# Patient Record
Sex: Female | Born: 1975 | Race: Black or African American | Hispanic: No | Smoking: Current every day smoker
Health system: Southern US, Community
[De-identification: ages and names within clinical notes are randomized; demographics above are authoritative.]

## PROBLEM LIST (undated history)

## (undated) DIAGNOSIS — E785 Hyperlipidemia, unspecified: Secondary | ICD-10-CM

## (undated) DIAGNOSIS — E119 Type 2 diabetes mellitus without complications: Secondary | ICD-10-CM

## (undated) DIAGNOSIS — I10 Essential (primary) hypertension: Secondary | ICD-10-CM

## (undated) HISTORY — PX: BREAST LUMPECTOMY: SHX2

## (undated) HISTORY — PX: ABDOMINAL HYSTERECTOMY: SHX81

---

## 2018-03-06 ENCOUNTER — Emergency Department: Payer: Self-pay

## 2018-03-06 ENCOUNTER — Emergency Department
Admission: EM | Admit: 2018-03-06 | Discharge: 2018-03-06 | Disposition: A | Discharge status: Home / Self Care | Payer: Self-pay | Attending: Emergency Medicine | Admitting: Emergency Medicine

## 2018-03-06 DIAGNOSIS — F17200 Nicotine dependence, unspecified, uncomplicated: Secondary | ICD-10-CM | POA: Insufficient documentation

## 2018-03-06 DIAGNOSIS — I10 Essential (primary) hypertension: Secondary | ICD-10-CM | POA: Insufficient documentation

## 2018-03-06 DIAGNOSIS — Y929 Unspecified place or not applicable: Secondary | ICD-10-CM | POA: Insufficient documentation

## 2018-03-06 DIAGNOSIS — S62525A Nondisplaced fracture of distal phalanx of left thumb, initial encounter for closed fracture: Secondary | ICD-10-CM

## 2018-03-06 DIAGNOSIS — E119 Type 2 diabetes mellitus without complications: Secondary | ICD-10-CM | POA: Insufficient documentation

## 2018-03-06 DIAGNOSIS — Y999 Unspecified external cause status: Secondary | ICD-10-CM | POA: Insufficient documentation

## 2018-03-06 DIAGNOSIS — W2105XA Struck by basketball, initial encounter: Secondary | ICD-10-CM | POA: Insufficient documentation

## 2018-03-06 DIAGNOSIS — Y9367 Activity, basketball: Secondary | ICD-10-CM | POA: Insufficient documentation

## 2018-03-06 HISTORY — DX: Hyperlipidemia, unspecified: E78.5

## 2018-03-06 HISTORY — DX: Type 2 diabetes mellitus without complications: E11.9

## 2018-03-06 HISTORY — DX: Essential (primary) hypertension: I10

## 2018-03-06 MED ORDER — TRAMADOL HCL 50 MG PO TABS
100.0000 mg | ORAL_TABLET | Freq: Once | ORAL | Status: AC
  Administered 2018-03-06: 100 mg via ORAL
  Filled 2018-03-06: qty 2

## 2018-03-06 MED ORDER — TRAMADOL HCL 50 MG PO TABS: 50.0000 mg | ORAL_TABLET | Freq: Four times a day (QID) | ORAL | 0 refills | Status: AC | PRN

## 2018-03-06 NOTE — ED Notes (Signed)
Pt c/o left thumb pain x3days from playing basketball and hitting her thumb against the ball. Pt has noticeable swelling and is unable to bend affected finger.

## 2018-03-06 NOTE — ED Triage Notes (Signed)
Patient jammed/hurt 1st digit left hand playing basketball approx 3 days ago. Patient reports she cannot flex/extend finger.

## 2018-03-06 NOTE — Discharge Instructions (Signed)
Keep the splint in place until you are evaluated by ortho. Take the pain medicine as needed. Apply ice to reduce pain and swelling.

## 2018-03-07 NOTE — ED Provider Notes (Signed)
[facility] Emergency Department Provider Note ____________________________________________  Time seen: 2105  I have reviewed the triage vital signs and the nursing notes.  HISTORY  Chief Complaint  Finger Injury  HPI Kristin Snow is a 42 y.o.right-handed female presents to the ED for evaluation of left thumb pain.  Patient describes an injury occurring about 3 days prior.  She admits to playing basketball and she went up to swat the basketball and noted her left thumb nail got caught and hyperextended.  She thought at the time that she had torn the nail away.  Since that time she had pain, swelling, and bruising to the fat pad of the thumb.  She presents now due to increased pain, swelling, and disability to the thumb.  She denies any other injury at this time.  Past Medical History:  Diagnosis Date  . Diabetes mellitus without complication (HCC)   . Hyperlipidemia   . Hypertension     There are no active problems to display for this patient.   Past Surgical History:  Procedure Laterality Date  . ABDOMINAL HYSTERECTOMY     [facility]  . BREAST LUMPECTOMY      Prior to Admission medications   Medication Sig Start Date End Date Taking? Authorizing Provider  traMADol (ULTRAM) 50 MG tablet Take 1 tablet (50 mg total) by mouth every 6 (six) hours as needed. 03/06/18 03/06/19  Raffael Bugarin, Charlesetta IvoryJenise V Bacon, PA-C    Allergies Penicillins and Reglan [metoclopramide]  No family history on file.  Social History Social History   Tobacco Use  . Smoking status: Current Every Day Smoker    Packs/day: 0.50  . Smokeless tobacco: Never Used  Substance Use Topics  . Alcohol use: Yes    Comment: occiassional  . Drug use: No    Review of Systems  Constitutional: Negative for fever. Cardiovascular: Negative for chest pain. Respiratory: Negative for shortness of breath. Musculoskeletal: Negative for back pain.  Left thumb pain as above. Skin: Negative for  rash. Neurological: Negative for headaches, focal weakness or numbness. ____________________________________________  PHYSICAL EXAM:  VITAL SIGNS: ED Triage Vitals [03/06/18 1940]  Enc Vitals Group     BP 140/64     Pulse Rate 94     Resp 18     Temp 98.3 F (36.8 C)     Temp Source Oral     SpO2 99 %     Weight 203 lb (92.1 kg)     Height 5\' 5"  (1.651 m)     Head Circumference      Peak Flow      Pain Score 8     Pain Loc      Pain Edu?      Excl. in GC?     Constitutional: Alert and oriented. Well appearing and in no distress. Head: Normocephalic and atraumatic. Cardiovascular: Normal rate, regular rhythm. Normal distal pulses. Respiratory: Normal respiratory effort.  Musculoskeletal: Left thumb without obvious deformity.  Patient does have some ecchymosis and edema noted to the distal palmar fat pad.  There is a very small subungual hematoma at the cuticle.  Patient with tenderness to palpation over the distal phalanx of the thumb.  Normal composite fist.  Nontender with normal range of motion in all extremities.  Neurologic:  Normal gross sensation.  Normal transient opposition testing. No gross focal neurologic deficits are appreciated. Skin:  Skin is warm, dry and intact. No rash noted. ____________________________________________   RADIOLOGY  Left Thumb IMPRESSION: Fracture of the  distal phalanx of the thumb. ____________________________________________  PROCEDURES  Procedures Tramadol 100 mg PO Thumb alumafoam splint ____________________________________________  INITIAL IMPRESSION / ASSESSMENT AND PLAN / ED COURSE  Patient with ED evaluation of acute left thumb pain and initial fracture management.  Patient sustained injury after she smacked a basketball with the hand.  She has sustained a comminuted distal pharynx fracture of the left thumb.  There is no nailbed injury or overlying laceration.  Patient is placed in an AlumaFoam splint and given instructions  to follow-up with orthopedics.  She is given a perception for tramadol for pain relief. ____________________________________________  FINAL CLINICAL IMPRESSION(S) / ED DIAGNOSES  Final diagnoses:  Closed nondisplaced fracture of distal phalanx of left thumb, initial encounter      Lissa Hoard, PA-C 03/07/18 1635    Myrna Blazer, MD 03/07/18 [phone]

## 2019-03-08 IMAGING — CR DG FINGER THUMB 2+V*L*
3 series · 3 of 3 positions shown · non-contrast
Comparison: None.

CLINICAL DATA: 41-year-old female with trauma to the left thumb.

EXAM:
LEFT THUMB 2+V

[finger ap]
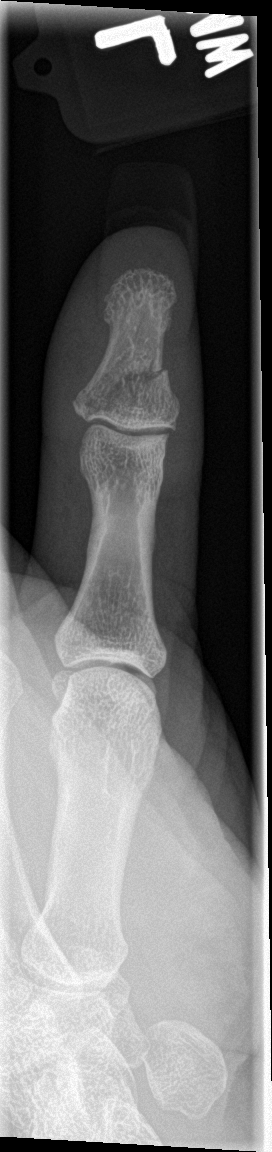

[finger obl]
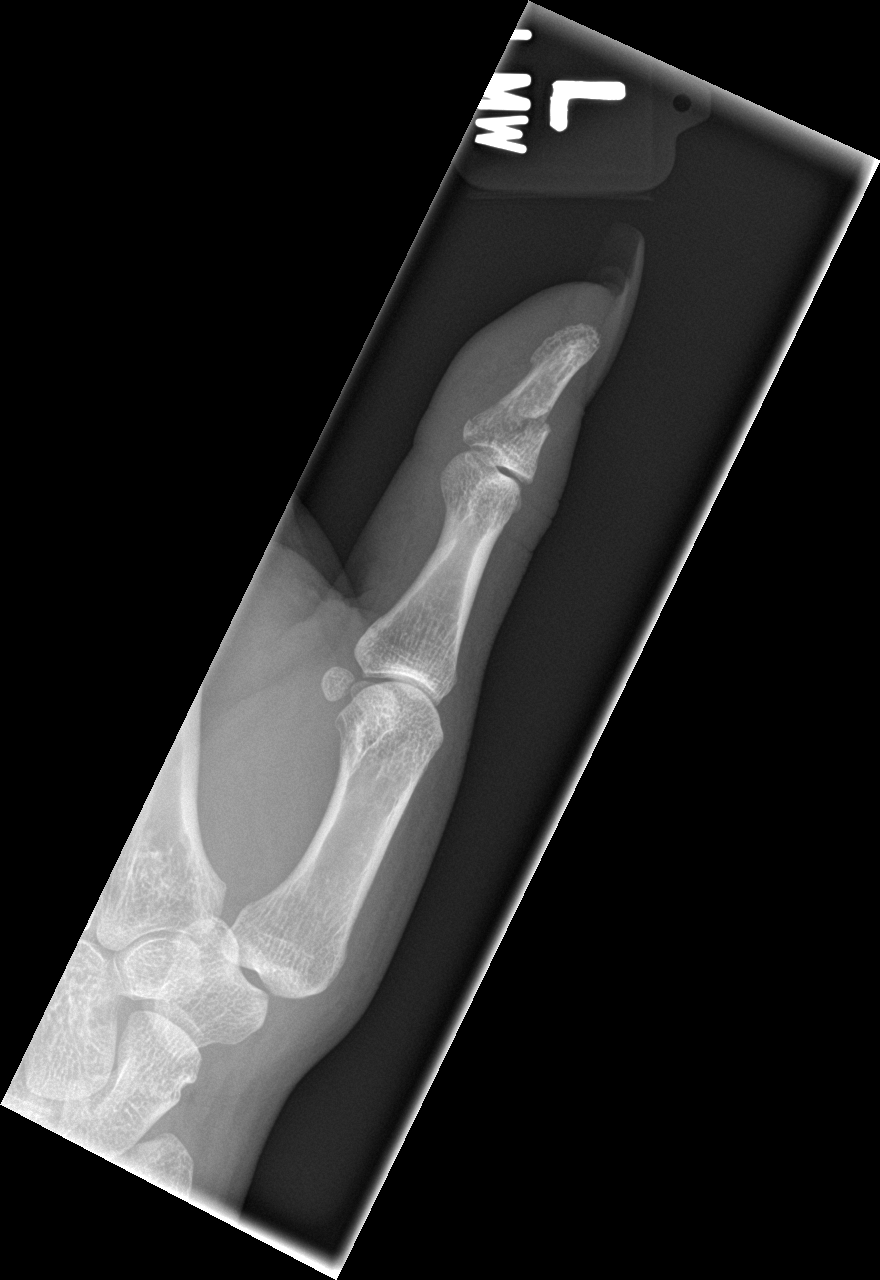

[finger lat]
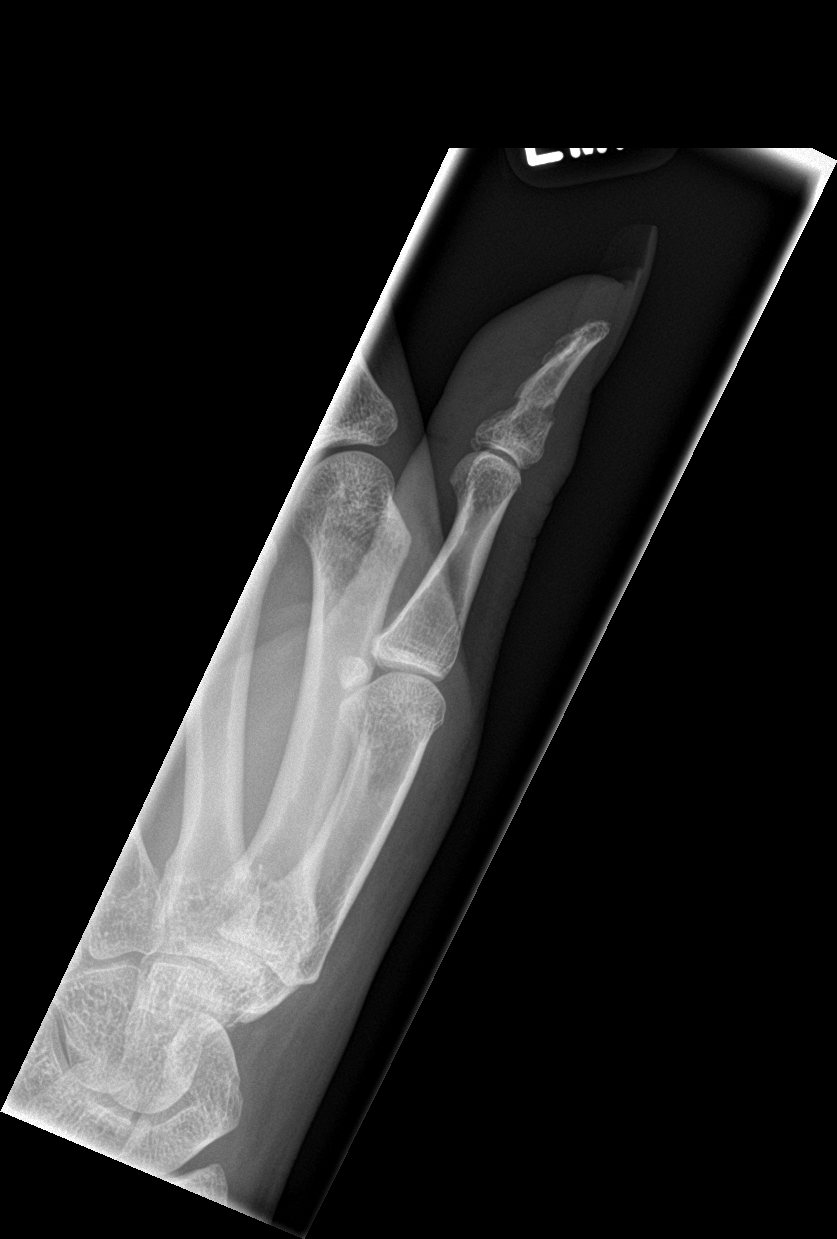

[3 of 3 positions shown; findings below may reference images not displayed]

FINDINGS: There is a minimally displaced oblique and comminuted appearing
fracture of the distal phalanx of the thumb with possible extension
to the articular surface. There is no dislocation. There is mild
soft tissue swelling of the thumb.
IMPRESSION: Fracture of the distal phalanx of the thumb.
